# Patient Record
Sex: Male | Born: 1980 | Race: White | Hispanic: No | Marital: Single | Smoking: Current every day smoker
Health system: Southern US, Community
[De-identification: ages and names within clinical notes are randomized; demographics above are authoritative.]

## PROBLEM LIST (undated history)

## (undated) HISTORY — PX: APPENDECTOMY: SHX54

## (undated) HISTORY — PX: WISDOM TOOTH EXTRACTION: SHX21

## (undated) HISTORY — PX: TONSILLECTOMY: SUR1361

---

## 2014-06-10 ENCOUNTER — Emergency Department (HOSPITAL_BASED_OUTPATIENT_CLINIC_OR_DEPARTMENT_OTHER)
Admission: EM | Admit: 2014-06-10 | Discharge: 2014-06-10 | Disposition: A | Discharge status: Home / Self Care | Payer: Self-pay | Attending: Emergency Medicine | Admitting: Emergency Medicine

## 2014-06-10 ENCOUNTER — Encounter (HOSPITAL_BASED_OUTPATIENT_CLINIC_OR_DEPARTMENT_OTHER): Payer: Self-pay | Admitting: *Deleted

## 2014-06-10 ENCOUNTER — Emergency Department (HOSPITAL_BASED_OUTPATIENT_CLINIC_OR_DEPARTMENT_OTHER): Payer: Self-pay

## 2014-06-10 DIAGNOSIS — Z791 Long term (current) use of non-steroidal anti-inflammatories (NSAID): Secondary | ICD-10-CM | POA: Insufficient documentation

## 2014-06-10 DIAGNOSIS — S46911A Strain of unspecified muscle, fascia and tendon at shoulder and upper arm level, right arm, initial encounter: Secondary | ICD-10-CM | POA: Insufficient documentation

## 2014-06-10 DIAGNOSIS — Z880 Allergy status to penicillin: Secondary | ICD-10-CM | POA: Insufficient documentation

## 2014-06-10 DIAGNOSIS — W208XXA Other cause of strike by thrown, projected or falling object, initial encounter: Secondary | ICD-10-CM | POA: Insufficient documentation

## 2014-06-10 DIAGNOSIS — Y9389 Activity, other specified: Secondary | ICD-10-CM | POA: Insufficient documentation

## 2014-06-10 DIAGNOSIS — Y998 Other external cause status: Secondary | ICD-10-CM | POA: Insufficient documentation

## 2014-06-10 DIAGNOSIS — Z720 Tobacco use: Secondary | ICD-10-CM | POA: Insufficient documentation

## 2014-06-10 DIAGNOSIS — Y9289 Other specified places as the place of occurrence of the external cause: Secondary | ICD-10-CM | POA: Insufficient documentation

## 2014-06-10 MED ORDER — TRAMADOL HCL 50 MG PO TABS: 50.0000 mg | ORAL_TABLET | Freq: Four times a day (QID) | ORAL | Status: DC | PRN

## 2014-06-10 NOTE — ED Provider Notes (Signed)
CSN: [id number]     Arrival date & time 06/10/14  1436 History   First MD Initiated Contact with Patient 06/10/14 1451     Chief Complaint  Patient presents with  . Shoulder Injury     (Consider location/radiation/quality/duration/timing/severity/associated sxs/prior Treatment) HPI Comments: Pt states that he was moving a dresser and the other person dropped it and now he is having right shoulder pain that radiates to the right neck. No numbness or weakness. No previous injury  Patient is a 34 y.o. male presenting with shoulder injury. The history is provided by the patient. No language interpreter was used.  Shoulder Injury This is a new problem. The current episode started today. The problem occurs constantly. The problem has been unchanged. Pertinent negatives include no numbness or weakness. The symptoms are aggravated by bending. He has tried nothing for the symptoms.    History reviewed. No pertinent past medical history. Past Surgical History  Procedure Laterality Date  . Wisdom tooth extraction    . Appendectomy    . Tonsillectomy     No family history on file. History  Substance Use Topics  . Smoking status: Current Every Day Smoker -- 0.50 packs/day    Types: Cigarettes  . Smokeless tobacco: Never Used  . Alcohol Use: No    Review of Systems  Neurological: Negative for weakness and numbness.  All other systems reviewed and are negative.     Allergies  Penicillins  Home Medications   Prior to Admission medications   Medication Sig Start Date End Date Taking? Authorizing Provider  meloxicam (MOBIC) 7.5 MG tablet Take 7.5 mg by mouth daily.   Yes Historical Provider, MD   BP 136/84 mmHg  Pulse 101  Temp(Src) 97.8 F (36.6 C) (Oral)  Resp 16  Ht 6\' 1"  (1.854 m)  Wt 215 lb (97.523 kg)  BMI 28.37 kg/m2  SpO2 100% Physical Exam  Constitutional: He is oriented to person, place, and time. He appears well-developed and well-nourished.  Cardiovascular: Normal  rate and regular rhythm.   Pulmonary/Chest: Effort normal and breath sounds normal.  Musculoskeletal:  Tender to the anterior shoulder. Unable to posteriorly rotates. Grip strength equal  Neurological: He is alert and oriented to person, place, and time. He exhibits normal muscle tone. Coordination normal.  Skin: Skin is warm and dry.  Nursing note and vitals reviewed.   ED Course  Procedures (including critical care time) Labs Review Labs Reviewed - No data to display  Imaging Review Dg Shoulder Right  06/10/2014   CLINICAL DATA:  Lifting injury. Right shoulder pain. Initial encounter.  EXAM: RIGHT SHOULDER - 2+ VIEW  COMPARISON:  None.  FINDINGS: The mineralization and alignment are normal. There is no evidence of acute fracture or dislocation. The subacromial space is preserved.  IMPRESSION: No acute osseous findings.   Electronically Signed   By: Roxy HorsemanBill  Veazey M.D.   On: 06/10/2014 15:39     EKG Interpretation None      MDM   Final diagnoses:  Shoulder strain, right, initial encounter    Pt is neurologically intact. Will treat symptomatically with ultram. Pt given referral to Dr. Pearletha ForgeHudnall. No bony abnormality noted    Teressa LowerVrinda Patti Shorb, NP 06/10/14 1543  Elwin MochaBlair Walden, MD 06/10/14 [phone]

## 2014-06-10 NOTE — Discharge Instructions (Signed)

## 2014-06-10 NOTE — ED Notes (Signed)
Reports was carrying a dresser with assistance and other side was dropped and dresser pulled right down- c/o pain to right shoulder- able to move arm with difficulty- cms intact

## 2014-07-04 ENCOUNTER — Encounter (HOSPITAL_BASED_OUTPATIENT_CLINIC_OR_DEPARTMENT_OTHER): Payer: Self-pay

## 2014-07-04 ENCOUNTER — Emergency Department (HOSPITAL_BASED_OUTPATIENT_CLINIC_OR_DEPARTMENT_OTHER): Payer: BLUE CROSS/BLUE SHIELD

## 2014-07-04 ENCOUNTER — Emergency Department (HOSPITAL_BASED_OUTPATIENT_CLINIC_OR_DEPARTMENT_OTHER)
Admission: EM | Admit: 2014-07-04 | Discharge: 2014-07-04 | Disposition: A | Discharge status: Home / Self Care | Payer: BLUE CROSS/BLUE SHIELD | Attending: Emergency Medicine | Admitting: Emergency Medicine

## 2014-07-04 DIAGNOSIS — Z880 Allergy status to penicillin: Secondary | ICD-10-CM | POA: Diagnosis not present

## 2014-07-04 DIAGNOSIS — L02511 Cutaneous abscess of right hand: Secondary | ICD-10-CM | POA: Diagnosis not present

## 2014-07-04 DIAGNOSIS — Z791 Long term (current) use of non-steroidal anti-inflammatories (NSAID): Secondary | ICD-10-CM | POA: Diagnosis not present

## 2014-07-04 DIAGNOSIS — M79644 Pain in right finger(s): Secondary | ICD-10-CM | POA: Diagnosis present

## 2014-07-04 DIAGNOSIS — Z720 Tobacco use: Secondary | ICD-10-CM | POA: Insufficient documentation

## 2014-07-04 MED ORDER — CLINDAMYCIN HCL 300 MG PO CAPS: 300.0000 mg | ORAL_CAPSULE | Freq: Three times a day (TID) | ORAL | Status: DC

## 2014-07-04 MED ORDER — OXYCODONE-ACETAMINOPHEN 5-325 MG PO TABS
ORAL_TABLET | ORAL | Status: AC
  Administered 2014-07-04: 1 via ORAL
  Filled 2014-07-04: qty 1

## 2014-07-04 MED ORDER — LIDOCAINE HCL 2 % IJ SOLN
10.0000 mL | Freq: Once | INTRAMUSCULAR | Status: AC
  Administered 2014-07-04: 200 mg
  Filled 2014-07-04: qty 20

## 2014-07-04 MED ORDER — OXYCODONE-ACETAMINOPHEN 5-325 MG PO TABS: 2.0000 | ORAL_TABLET | Freq: Once | ORAL | Status: DC

## 2014-07-04 MED ORDER — OXYCODONE-ACETAMINOPHEN 5-325 MG PO TABS: 2.0000 | ORAL_TABLET | ORAL | Status: DC | PRN

## 2014-07-04 MED ORDER — OXYCODONE-ACETAMINOPHEN 5-325 MG PO TABS
1.0000 | ORAL_TABLET | Freq: Once | ORAL | Status: AC
  Administered 2014-07-04: 1 via ORAL

## 2014-07-04 MED ORDER — CLINDAMYCIN HCL 150 MG PO CAPS
300.0000 mg | ORAL_CAPSULE | Freq: Once | ORAL | Status: AC
  Administered 2014-07-04: 300 mg via ORAL
  Filled 2014-07-04: qty 2

## 2014-07-04 NOTE — ED Provider Notes (Signed)
CSN: [id number]     Arrival date & time 07/04/14  1445 History  This chart was scribed for Richardean Canal, MD by Modena Jansky, ED Scribe. This patient was seen in room [facility] and the patient's care was started at 5:25 PM.    Chief Complaint  Patient presents with  . Finger Injury   The history is provided by the patient. No language interpreter was used.  HPI Comments: Shaurya Rawdon is a 34 y.o. male who presents to the Emergency Department complaining of right finger injury that occurred 5 days ago. He states that he accidentally got a splinter stuck in his finger of his right hand. He reports that he waited to come the ED to see if the problems will resolve itself. He states that he noticed some swelling and redness on his right finger. He denies any hx of DM. He also denies any fever. He states that his tetanus status is UTD.    History reviewed. No pertinent past medical history. Past Surgical History  Procedure Laterality Date  . Wisdom tooth extraction    . Appendectomy    . Tonsillectomy     No family history on file. History  Substance Use Topics  . Smoking status: Current Every Day Smoker -- 0.50 packs/day    Types: Cigarettes  . Smokeless tobacco: Never Used  . Alcohol Use: No    Review of Systems  Constitutional: Negative for fever.  All other systems reviewed and are negative.  Allergies  Penicillins  Home Medications   Prior to Admission medications   Medication Sig Start Date End Date Taking? Authorizing Provider  meloxicam (MOBIC) 7.5 MG tablet Take 7.5 mg by mouth daily.    Historical Provider, MD   BP 132/90 mmHg  Pulse 98  Temp(Src) 98.2 F (36.8 C) (Oral)  Resp 20  Ht  (1.854 m)  Wt 215 lb (97.523 kg)  BMI 28.37 kg/m2  SpO2 98% Physical Exam  Constitutional: He is oriented to person, place, and time. He appears well-developed. No distress.  HENT:  Head: Normocephalic and atraumatic.  Neck: Neck supple. No tracheal deviation present.   Cardiovascular: Normal rate.   Pulmonary/Chest: Effort normal. No respiratory distress.  Musculoskeletal:  Decreased ROM due to swelling and pain.    Neurological: He is alert and oriented to person, place, and time.  Skin: Skin is warm and dry.  Splinter in the right 3rd digit, mid phalanx with no erythema, no bony TTP, no perionychia.  Cap reill is less than 2 seconds.   Psychiatric: He has a normal mood and affect. His behavior is normal.  Nursing note and vitals reviewed.   ED Course  FOREIGN BODY REMOVAL Date/Time: 07/04/2014 6:33 PM Performed by: Richardean Canal Authorized by: Richardean Canal Consent: Verbal consent obtained. Risks and benefits: risks, benefits and alternatives were discussed Consent given by: patient Patient understanding: patient states understanding of the procedure being performed Patient consent: the patient's understanding of the procedure matches consent given Procedure consent: procedure consent matches procedure scheduled Relevant documents: relevant documents present and verified Test results: test results available and properly labeled Required items: required blood products, implants, devices, and special equipment available Patient identity confirmed: verbally with patient Time out: Immediately prior to procedure a "time out" was called to verify the correct patient, procedure, equipment, support staff and site/side marked as required. Intake: R 3rd finger. Local anesthetic: lidocaine 2% without epinephrine Anesthetic total: 5 ml Patient sedated: no Patient restrained: yes Complexity:  simple 1 objects recovered. Post-procedure assessment: foreign body removed Patient tolerance: Patient tolerated the procedure well with no immediate complications Comments: Removed small splinter    (including critical care time) DIAGNOSTIC STUDIES: Oxygen Saturation is 98% on RA, normal by my interpretation.    COORDINATION OF CARE: 5:29 PM- Pt advised of plan  for treatment which includes medication and radiology and pt agrees.  Labs Review Labs Reviewed - No data to display  Imaging Review Dg Hand Complete Right  07/04/2014   CLINICAL DATA:  Splinter in right third finger 4 days ago. Swelling. Limited range of motion.  EXAM: RIGHT HAND - COMPLETE 3+ VIEW  COMPARISON:  None.  FINDINGS: Diffuse soft tissue swelling within the right middle finger. No visible radiopaque foreign body. There is a tiny bone fragment along the anterior base of the right third finger middle phalanx, most likely related to old injury. No acute bony abnormality.  IMPRESSION: Diffuse soft tissue swelling within the right middle finger. No radiopaque foreign body or acute bony abnormality.   Electronically Signed   By: Charlett NoseKevin  Dover M.D.   On: 07/04/2014 15:14     EKG Interpretation None      MDM   Final diagnoses:  None   Charlott RakesJoseph Reap is a 34 y.o. male here with splinter R 3rd finger with cellulitis. I was able to remove small splinter, there is some purulent discharge suggestive of small abscess as well. I was able to drain the abscess. Will dc home with clinda, percocet.   I personally performed the services described in this documentation, which was scribed in my presence. The recorded information has been reviewed and is accurate.    Richardean Canalavid H Yao, MD 07/04/14 [id number]

## 2014-07-04 NOTE — Discharge Instructions (Signed)
Take clindamycin 300 mg three times daily for a week.   Take motrin for pain.   Take percocet for severe pain.   Follow up with your doctor.   Return to ER if you have severe pain, fever, worse swelling.

## 2014-07-05 ENCOUNTER — Ambulatory Visit (HOSPITAL_BASED_OUTPATIENT_CLINIC_OR_DEPARTMENT_OTHER)
Admission: EM | Admit: 2014-07-05 | Discharge: 2014-07-06 | Disposition: A | Discharge status: Home / Self Care | Payer: BLUE CROSS/BLUE SHIELD | Attending: Orthopedic Surgery | Admitting: Orthopedic Surgery

## 2014-07-05 ENCOUNTER — Emergency Department (HOSPITAL_COMMUNITY): Payer: BLUE CROSS/BLUE SHIELD | Admitting: Certified Registered"

## 2014-07-05 ENCOUNTER — Encounter (HOSPITAL_BASED_OUTPATIENT_CLINIC_OR_DEPARTMENT_OTHER): Payer: Self-pay | Admitting: *Deleted

## 2014-07-05 ENCOUNTER — Encounter (HOSPITAL_COMMUNITY)
Admission: EM | Disposition: A | Discharge status: Home / Self Care | Payer: Self-pay | Source: Home / Self Care | Attending: Emergency Medicine

## 2014-07-05 DIAGNOSIS — M65141 Other infective (teno)synovitis, right hand: Secondary | ICD-10-CM | POA: Diagnosis not present

## 2014-07-05 DIAGNOSIS — M009 Pyogenic arthritis, unspecified: Secondary | ICD-10-CM | POA: Diagnosis not present

## 2014-07-05 DIAGNOSIS — F17210 Nicotine dependence, cigarettes, uncomplicated: Secondary | ICD-10-CM | POA: Diagnosis not present

## 2014-07-05 DIAGNOSIS — M79641 Pain in right hand: Secondary | ICD-10-CM | POA: Diagnosis present

## 2014-07-05 DIAGNOSIS — M659 Synovitis and tenosynovitis, unspecified: Secondary | ICD-10-CM | POA: Diagnosis present

## 2014-07-05 DIAGNOSIS — L02511 Cutaneous abscess of right hand: Secondary | ICD-10-CM

## 2014-07-05 HISTORY — PX: I & D EXTREMITY: SHX5045

## 2014-07-05 LAB — CBC WITH DIFFERENTIAL/PLATELET
Basophils Absolute: 0 10*3/uL (ref 0.0–0.1)
Basophils Relative: 0 % (ref 0–1)
Eosinophils Absolute: 0.2 10*3/uL (ref 0.0–0.7)
Eosinophils Relative: 1 % (ref 0–5)
HCT: 48.8 % (ref 39.0–52.0)
Hemoglobin: 16.2 g/dL (ref 13.0–17.0)
LYMPHS ABS: 2.6 10*3/uL (ref 0.7–4.0)
Lymphocytes Relative: 20 % (ref 12–46)
MCH: 30 pg (ref 26.0–34.0)
MCHC: 33.2 g/dL (ref 30.0–36.0)
MCV: 90.4 fL (ref 78.0–100.0)
MONO ABS: 0.9 10*3/uL (ref 0.1–1.0)
Monocytes Relative: 7 % (ref 3–12)
NEUTROS ABS: 9.5 10*3/uL — AB (ref 1.7–7.7)
NEUTROS PCT: 72 % (ref 43–77)
PLATELETS: 159 10*3/uL (ref 150–400)
RBC: 5.4 MIL/uL (ref 4.22–5.81)
RDW: 13.5 % (ref 11.5–15.5)
WBC: 13.1 10*3/uL — ABNORMAL HIGH (ref 4.0–10.5)

## 2014-07-05 LAB — BASIC METABOLIC PANEL
Anion gap: 5 (ref 5–15)
BUN: 10 mg/dL (ref 6–23)
CALCIUM: 8.8 mg/dL (ref 8.4–10.5)
CO2: 28 mmol/L (ref 19–32)
Chloride: 100 mmol/L (ref 96–112)
Creatinine, Ser: 1.14 mg/dL (ref 0.50–1.35)
GFR calc non Af Amer: 83 mL/min — ABNORMAL LOW (ref >90)
Glucose, Bld: 87 mg/dL (ref 70–99)
Potassium: 3.5 mmol/L (ref 3.5–5.1)
Sodium: 133 mmol/L — ABNORMAL LOW (ref 135–145)

## 2014-07-05 LAB — SEDIMENTATION RATE: Sed Rate: 12 mm/hr (ref 0–16)

## 2014-07-05 SURGERY — IRRIGATION AND DEBRIDEMENT EXTREMITY
Anesthesia: General | Site: Finger | Laterality: Right

## 2014-07-05 MED ORDER — MIDAZOLAM HCL 2 MG/2ML IJ SOLN
INTRAMUSCULAR | Status: AC
  Filled 2014-07-05: qty 2

## 2014-07-05 MED ORDER — SODIUM CHLORIDE 0.9 % IV BOLUS (SEPSIS)
500.0000 mL | Freq: Once | INTRAVENOUS | Status: AC
  Administered 2014-07-05: 500 mL via INTRAVENOUS

## 2014-07-05 MED ORDER — LACTATED RINGERS IV SOLN
INTRAVENOUS | Status: DC | PRN
  Administered 2014-07-05: via INTRAVENOUS

## 2014-07-05 MED ORDER — MORPHINE SULFATE 4 MG/ML IJ SOLN
4.0000 mg | Freq: Once | INTRAMUSCULAR | Status: AC
Start: 2014-07-05 — End: 2014-07-05
  Administered 2014-07-05: 4 mg via INTRAVENOUS
  Filled 2014-07-05: qty 1

## 2014-07-05 MED ORDER — PROPOFOL 10 MG/ML IV BOLUS
INTRAVENOUS | Status: AC
  Filled 2014-07-05: qty 20

## 2014-07-05 MED ORDER — VANCOMYCIN HCL IN DEXTROSE 1-5 GM/200ML-% IV SOLN
1000.0000 mg | Freq: Once | INTRAVENOUS | Status: AC
  Administered 2014-07-05: 1000 mg via INTRAVENOUS
  Filled 2014-07-05: qty 200

## 2014-07-05 MED ORDER — MORPHINE SULFATE 4 MG/ML IJ SOLN
4.0000 mg | Freq: Once | INTRAMUSCULAR | Status: AC
  Administered 2014-07-05: 4 mg via INTRAVENOUS
  Filled 2014-07-05: qty 1

## 2014-07-05 MED ORDER — SUFENTANIL CITRATE 50 MCG/ML IV SOLN
INTRAVENOUS | Status: AC
  Filled 2014-07-05: qty 1

## 2014-07-05 SURGICAL SUPPLY — 54 items
BANDAGE COBAN STERILE 2 (GAUZE/BANDAGES/DRESSINGS) IMPLANT
BANDAGE ELASTIC 3 VELCRO ST LF (GAUZE/BANDAGES/DRESSINGS) ×3 IMPLANT
BANDAGE ELASTIC 4 VELCRO ST LF (GAUZE/BANDAGES/DRESSINGS) IMPLANT
BNDG COHESIVE 1X5 TAN STRL LF (GAUZE/BANDAGES/DRESSINGS) IMPLANT
BNDG CONFORM 2 STRL LF (GAUZE/BANDAGES/DRESSINGS) IMPLANT
BNDG ESMARK 4X9 LF (GAUZE/BANDAGES/DRESSINGS) ×3 IMPLANT
BNDG GAUZE ELAST 4 BULKY (GAUZE/BANDAGES/DRESSINGS) ×3 IMPLANT
CORDS BIPOLAR (ELECTRODE) ×3 IMPLANT
COVER SURGICAL LIGHT HANDLE (MISCELLANEOUS) ×3 IMPLANT
DECANTER SPIKE VIAL GLASS SM (MISCELLANEOUS) ×3 IMPLANT
DRAIN PENROSE 1/4X12 LTX STRL (WOUND CARE) IMPLANT
DRSG ADAPTIC 3X8 NADH LF (GAUZE/BANDAGES/DRESSINGS) IMPLANT
DRSG EMULSION OIL 3X3 NADH (GAUZE/BANDAGES/DRESSINGS) IMPLANT
DRSG PAD ABDOMINAL 8X10 ST (GAUZE/BANDAGES/DRESSINGS) IMPLANT
GAUZE PACKING IODOFORM 1/4X15 (GAUZE/BANDAGES/DRESSINGS) ×3 IMPLANT
GAUZE SPONGE 4X4 12PLY STRL (GAUZE/BANDAGES/DRESSINGS) ×3 IMPLANT
GAUZE XEROFORM 1X8 LF (GAUZE/BANDAGES/DRESSINGS) IMPLANT
GLOVE BIO SURGEON STRL SZ7.5 (GLOVE) ×3 IMPLANT
GLOVE BIO SURGEON STRL SZ8 (GLOVE) ×3 IMPLANT
GLOVE BIOGEL PI IND STRL 8 (GLOVE) ×2 IMPLANT
GLOVE BIOGEL PI INDICATOR 8 (GLOVE) ×4
GOWN STRL REUS W/ TWL XL LVL3 (GOWN DISPOSABLE) ×1 IMPLANT
GOWN STRL REUS W/TWL XL LVL3 (GOWN DISPOSABLE) ×2
HANDPIECE INTERPULSE COAX TIP (DISPOSABLE)
KIT BASIN OR (CUSTOM PROCEDURE TRAY) ×3 IMPLANT
KIT ROOM TURNOVER OR (KITS) ×3 IMPLANT
LOOP VESSEL MAXI BLUE (MISCELLANEOUS) IMPLANT
LOOP VESSEL MINI RED (MISCELLANEOUS) IMPLANT
MANIFOLD NEPTUNE II (INSTRUMENTS) IMPLANT
NEEDLE 25GAX1.5 (MISCELLANEOUS) ×3 IMPLANT
NEEDLE HYPO 25X1 1.5 SAFETY (NEEDLE) IMPLANT
NS IRRIG 1000ML POUR BTL (IV SOLUTION) ×3 IMPLANT
PACK ORTHO EXTREMITY (CUSTOM PROCEDURE TRAY) ×3 IMPLANT
PAD ARMBOARD 7.5X6 YLW CONV (MISCELLANEOUS) ×3 IMPLANT
SCRUB BETADINE 4OZ XXX (MISCELLANEOUS) ×3 IMPLANT
SET HNDPC FAN SPRY TIP SCT (DISPOSABLE) IMPLANT
SOLUTION BETADINE 4OZ (MISCELLANEOUS) ×3 IMPLANT
SPONGE LAP 18X18 X RAY DECT (DISPOSABLE) IMPLANT
SPONGE LAP 4X18 X RAY DECT (DISPOSABLE) IMPLANT
SUCTION FRAZIER TIP 10 FR DISP (SUCTIONS) IMPLANT
SUT ETHILON 4 0 PS 2 18 (SUTURE) IMPLANT
SUT MON AB 5-0 P3 18 (SUTURE) IMPLANT
SWAB COLLECTION DEVICE MRSA (MISCELLANEOUS) ×3 IMPLANT
SYR 30ML LL (SYRINGE) ×3 IMPLANT
SYR CONTROL 10ML LL (SYRINGE) ×3 IMPLANT
TOWEL OR 17X24 6PK STRL BLUE (TOWEL DISPOSABLE) IMPLANT
TOWEL OR 17X26 10 PK STRL BLUE (TOWEL DISPOSABLE) ×3 IMPLANT
TUBE ANAEROBIC SPECIMEN COL (MISCELLANEOUS) ×3 IMPLANT
TUBE CONNECTING 12'X1/4 (SUCTIONS) ×1
TUBE CONNECTING 12X1/4 (SUCTIONS) ×2 IMPLANT
TUBE FEEDING 5FR 15 INCH (TUBING) ×3 IMPLANT
UNDERPAD 30X30 INCONTINENT (UNDERPADS AND DIAPERS) ×3 IMPLANT
WATER STERILE IRR 1000ML POUR (IV SOLUTION) IMPLANT
YANKAUER SUCT BULB TIP NO VENT (SUCTIONS) IMPLANT

## 2014-07-05 NOTE — ED Provider Notes (Signed)
Patient transferred to the [facility] for hand surgery follow-up. Patient reported a splinter in his right third finger that was removed yesterday as well as IND with purulent drainage. He was given clindamycin. He is presenting today with worsening relative redness and swelling has extended into his hand. Pt also with systemic symptoms. Patient given IV vancomycin as well as pain medicines with plan for hand surgery evaluation. Pt given additional pain medications here. Patient stable in no acute distress. Patient seen by Dr. Merlyn LotKuzma with plan for surgical IND tonight for Right long finger flexor sheath infection and possible dip joint infection.     Louann SjogrenVictoria L Marcella Charlson, PA-C 07/05/14 2333  Vida RollerBrian D Miller, MD 07/06/14 0000

## 2014-07-05 NOTE — ED Notes (Signed)
Patient was here yesterday for a foreign body in his right hand. Today worsened with increased swelling and pain as well as streaking up his fingers.

## 2014-07-05 NOTE — H&P (Signed)
Jesse Barnett is an 34 y.o. male.   Chief Complaint: right long finger infection HPI: 34 yo rhd male states he got wood splinter in right long finger 6 days ago.  4 days ago began having swelling and pain in finger.  Splinter removed a few days ago, but over last 24 hours has had increasing pain, swelling and fevers/chills/night sweats.  Reports previous injury to right hand with drill years ago and no other injury at this time.  History reviewed. No pertinent past medical history.  Past Surgical History  Procedure Laterality Date  . Wisdom tooth extraction    . Appendectomy    . Tonsillectomy      No family history on file. Social History:  reports that he has been smoking Cigarettes.  He has been smoking about 0.50 packs per day. He has never used smokeless tobacco. He reports that he does not drink alcohol or use illicit drugs.  Allergies:  Allergies  Allergen Reactions  . Penicillins Rash     (Not in a hospital admission)  Results for orders placed or performed during the hospital encounter of 07/05/14 (from the past 48 hour(s))  CBC with Differential     Status: Abnormal   Collection Time: 07/05/14  7:35 PM  Result Value Ref Range   WBC 13.1 (H) 4.0 - 10.5 K/uL   RBC 5.40 4.22 - 5.81 MIL/uL   Hemoglobin 16.2 13.0 - 17.0 g/dL   HCT 48.8 39.0 - 52.0 %   MCV 90.4 78.0 - 100.0 fL   MCH 30.0 26.0 - 34.0 pg   MCHC 33.2 30.0 - 36.0 g/dL   RDW 13.5 11.5 - 15.5 %   Platelets 159 150 - 400 K/uL   Neutrophils Relative % 72 43 - 77 %   Neutro Abs 9.5 (H) 1.7 - 7.7 K/uL   Lymphocytes Relative 20 12 - 46 %   Lymphs Abs 2.6 0.7 - 4.0 K/uL   Monocytes Relative 7 3 - 12 %   Monocytes Absolute 0.9 0.1 - 1.0 K/uL   Eosinophils Relative 1 0 - 5 %   Eosinophils Absolute 0.2 0.0 - 0.7 K/uL   Basophils Relative 0 0 - 1 %   Basophils Absolute 0.0 0.0 - 0.1 K/uL  Basic metabolic panel     Status: Abnormal   Collection Time: 07/05/14  7:35 PM  Result Value Ref Range   Sodium 133 (L)  135 - 145 mmol/L   Potassium 3.5 3.5 - 5.1 mmol/L   Chloride 100 96 - 112 mmol/L   CO2 28 19 - 32 mmol/L   Glucose, Bld 87 70 - 99 mg/dL   BUN 10 6 - 23 mg/dL   Creatinine, Ser 1.14 0.50 - 1.35 mg/dL   Calcium 8.8 8.4 - 10.5 mg/dL   GFR calc non Af Amer 83 (L) >90 mL/min   GFR calc Af Amer >90 >90 mL/min    Comment: (NOTE) The eGFR has been calculated using the CKD EPI equation. This calculation has not been validated in all clinical situations. eGFR's persistently <90 mL/min signify possible Chronic Kidney Disease.    Anion gap 5 5 - 15  Sedimentation rate     Status: None   Collection Time: 07/05/14  7:35 PM  Result Value Ref Range   Sed Rate 12 0 - 16 mm/hr    Dg Hand Complete Right  07/04/2014   CLINICAL DATA:  Splinter in right third finger 4 days ago. Swelling. Limited range of motion.  EXAM:  RIGHT HAND - COMPLETE 3+ VIEW  COMPARISON:  None.  FINDINGS: Diffuse soft tissue swelling within the right middle finger. No visible radiopaque foreign body. There is a tiny bone fragment along the anterior base of the right third finger middle phalanx, most likely related to old injury. No acute bony abnormality.  IMPRESSION: Diffuse soft tissue swelling within the right middle finger. No radiopaque foreign body or acute bony abnormality.   Electronically Signed   By: Jesse Barnett M.D.   On: 07/04/2014 15:14     A comprehensive review of systems was negative except for: Constitutional: positive for chills, fevers and night sweats  Blood pressure 133/90, pulse 108, temperature 99.8 F (37.7 C), temperature source Oral, resp. rate 20, height _0  (1.854 m), weight 97.523 kg (215 lb), SpO2 100 %.  General appearance: alert, cooperative and appears stated age Head: Normocephalic, without obvious abnormality, atraumatic Neck: supple, symmetrical, trachea midline Resp: clear to auscultation bilaterally Cardio: regular rate and rhythm GI: non tender Extremities: intact sensation and  capillary refill all digits.  +epl/fpl/io.  right long finger with puncture wound volar aspect middle phalanx.  finger swollen and erythematous.  pain with palpation volarly and with passive extension.  also ttp dip joint dorsally.   Pulses: 2+ and symmetric Skin: Skin color, texture, turgor normal. No rashes or lesions Neurologic: Grossly normal Incision/Wound: As above  Assessment/Plan Right long finger flexor sheath infection and possible dip joint infection.  Non operative and operative treatment options were discussed with the patient and patient wishes to proceed with operative treatment. Recommend OR for incision and drainage right long finger including flexor sheath and possibly dip joint.  Risks, benefits, and alternatives of surgery were discussed and the patient agrees with the plan of care.   [name] R 07/05/2014, 11:04 PM

## 2014-07-05 NOTE — ED Provider Notes (Addendum)
CSN: [id number]     Arrival date & time 07/05/14  1719 History  This chart was scribed for [employer]. Rubin Payor, MD by Tonye Royalty, ED Scribe. This patient was seen in room MH08/MH08 and the patient's care was started at 7:11 PM.    Chief Complaint  Patient presents with  . Hand Pain   The history is provided by the patient. No language interpreter was used.    HPI Comments: Jesse Barnett is a 34 y.o. male who presents to the Emergency Department complaining of right hand pain with onset 6 days ago. Symptoms began after he got a splinted stuck in 3rd finger on right hand. He was evaluated yesterday, foreign body was removed, abscess was drained with purulent drainage, and started on Clindamycin. He states swelling has increased significantly since yesterday.  History reviewed. No pertinent past medical history. Past Surgical History  Procedure Laterality Date  . Wisdom tooth extraction    . Appendectomy    . Tonsillectomy     No family history on file. History  Substance Use Topics  . Smoking status: Current Every Day Smoker -- 0.50 packs/day    Types: Cigarettes  . Smokeless tobacco: Never Used  . Alcohol Use: No    Review of Systems  Constitutional: Negative for fever.  Musculoskeletal:       Right hand swelling and pain  All other systems reviewed and are negative.    Allergies  Penicillins  Home Medications   Prior to Admission medications   Medication Sig Start Date End Date Taking? Authorizing Provider  clindamycin (CLEOCIN) 300 MG capsule Take 1 capsule (300 mg total) by mouth 3 (three) times daily. X 7 days 07/04/14   Richardean Canal, MD  meloxicam (MOBIC) 7.5 MG tablet Take 7.5 mg by mouth daily.    Historical Provider, MD  oxyCODONE-acetaminophen (PERCOCET) 5-325 MG per tablet Take 2 tablets by mouth every 4 (four) hours as needed. 07/04/14   Richardean Canal, MD   BP 130/94 mmHg  Pulse 107  Temp(Src) 98.3 F (36.8 C) (Oral)  Resp 18  Ht  (1.854 m)  Wt 215 lb  (97.523 kg)  BMI 28.37 kg/m2  SpO2 100% Physical Exam  Constitutional: He is oriented to person, place, and time. He appears well-developed and well-nourished.  HENT:  Head: Normocephalic and atraumatic.  Eyes: Conjunctivae are normal.  Neck: Normal range of motion. Neck supple.  Pulmonary/Chest: Effort normal.  Musculoskeletal: Normal range of motion.  Right middle finger swollen, tender, erythematous, and tracking up to palm of hand Very painful flexion and extension 0.5cm laceration/incision over palmar aspect of the middle phalanx  Neurological: He is alert and oriented to person, place, and time.  Skin: Skin is warm and dry.  Psychiatric: He has a normal mood and affect.  Nursing note and vitals reviewed.       ED Course  Procedures (including critical care time)  COORDINATION OF CARE: 7:15 PM Discussed treatment plan with patient at beside, the patient agrees with the plan and has no further questions at this time.  Labs Review Labs Reviewed - No data to display  Imaging Review Dg Hand Complete Right  07/04/2014   CLINICAL DATA:  Splinter in right third finger 4 days ago. Swelling. Limited range of motion.  EXAM: RIGHT HAND - COMPLETE 3+ VIEW  COMPARISON:  None.  FINDINGS: Diffuse soft tissue swelling within the right middle finger. No visible radiopaque foreign body. There is a tiny bone fragment along  the anterior base of the right third finger middle phalanx, most likely related to old injury. No acute bony abnormality.  IMPRESSION: Diffuse soft tissue swelling within the right middle finger. No radiopaque foreign body or acute bony abnormality.   Electronically Signed   By: Charlett NoseKevin  Dover M.D.   On: 07/04/2014 15:14     EKG Interpretation None      MDM   Final diagnoses:  None    Patient with apparent infection of finger. Seen yesterday and had drainage with possible abscess. Has been on clindamycin. Has gotten much worse and is now having chills. More  swelling and now involves the hand. Will discuss with hand surgery and will require transfer.  I personally performed the services described in this documentation, which was scribed in my presence. The recorded information has been reviewed and is accurate.    Juliet RudeNathan R. Rubin PayorPickering, MD 07/05/14 2012  Discussed with Dr. Merlyn LotKuzma. Will see the patient in the ER at [facility]. Patient will go by private vehicle. Discussed with Dr. Loretha StaplerWofford, the ER physician at [facility] who is aware the patient.  Juliet RudeNathan R. Rubin PayorPickering, MD 07/05/14 2100

## 2014-07-05 NOTE — Anesthesia Preprocedure Evaluation (Addendum)
Anesthesia Evaluation  Patient identified by MRN, date of birth, ID band Patient awake    Reviewed: Allergy & Precautions, NPO status , Patient's Chart, lab work & pertinent test results  Airway Mallampati: II  TM Distance: >3 FB Neck ROM: Full    Dental no notable dental hx.    Pulmonary Current Smoker,  breath sounds clear to auscultation  Pulmonary exam normal       Cardiovascular negative cardio ROS  Rhythm:Regular Rate:Normal     Neuro/Psych negative neurological ROS  negative psych ROS   GI/Hepatic negative GI ROS, Neg liver ROS,   Endo/Other  negative endocrine ROS  Renal/GU negative Renal ROS  negative genitourinary   Musculoskeletal negative musculoskeletal ROS (+)   Abdominal   Peds negative pediatric ROS (+)  Hematology negative hematology ROS (+)   Anesthesia Other Findings   Reproductive/Obstetrics negative OB ROS                            Anesthesia Physical Anesthesia Plan  ASA: II and emergent  Anesthesia Plan: General   Post-op Pain Management:    Induction: Intravenous  Airway Management Planned: LMA  Additional Equipment:   Intra-op Plan:   Post-operative Plan: Extubation in OR  Informed Consent: I have reviewed the patients History and Physical, chart, labs and discussed the procedure including the risks, benefits and alternatives for the proposed anesthesia with the patient or authorized representative who has indicated his/her understanding and acceptance.   Dental advisory given  Plan Discussed with: CRNA  Anesthesia Plan Comments: (NPO > 8 hours)       Anesthesia Quick Evaluation

## 2014-07-05 NOTE — ED Notes (Signed)
Pt stable and leaving with wife to go to [facility] (all paperwork printed and with pt). Report given to Clydie BraunKaren, Consulting civil engineercharge RN at [facility]. Danna HeftyGolden, Arath Kaigler Lee, RN

## 2014-07-06 ENCOUNTER — Encounter (HOSPITAL_COMMUNITY): Payer: Self-pay | Admitting: Orthopedic Surgery

## 2014-07-06 DIAGNOSIS — M659 Synovitis and tenosynovitis, unspecified: Secondary | ICD-10-CM | POA: Diagnosis present

## 2014-07-06 MED ORDER — ONDANSETRON HCL 4 MG PO TABS: 4.0000 mg | ORAL_TABLET | Freq: Four times a day (QID) | ORAL | Status: DC | PRN

## 2014-07-06 MED ORDER — METOCLOPRAMIDE HCL 10 MG PO TABS: 5.0000 mg | ORAL_TABLET | Freq: Three times a day (TID) | ORAL | Status: DC | PRN

## 2014-07-06 MED ORDER — LIDOCAINE HCL (CARDIAC) 20 MG/ML IV SOLN
INTRAVENOUS | Status: AC
  Filled 2014-07-06: qty 5

## 2014-07-06 MED ORDER — KETOROLAC TROMETHAMINE 30 MG/ML IJ SOLN
INTRAMUSCULAR | Status: AC
Start: 2014-07-06 — End: 2014-07-06
  Administered 2014-07-06: 30 mg
  Filled 2014-07-06: qty 1

## 2014-07-06 MED ORDER — LACTATED RINGERS IV SOLN: INTRAVENOUS | Status: DC

## 2014-07-06 MED ORDER — KETOROLAC TROMETHAMINE 30 MG/ML IJ SOLN: 30.0000 mg | Freq: Once | INTRAMUSCULAR | Status: DC | PRN

## 2014-07-06 MED ORDER — OXYCODONE-ACETAMINOPHEN 5-325 MG PO TABS: ORAL_TABLET | ORAL | Status: DC

## 2014-07-06 MED ORDER — METOCLOPRAMIDE HCL 5 MG/ML IJ SOLN: 5.0000 mg | Freq: Three times a day (TID) | INTRAMUSCULAR | Status: DC | PRN

## 2014-07-06 MED ORDER — ONDANSETRON HCL 4 MG/2ML IJ SOLN: 4.0000 mg | Freq: Four times a day (QID) | INTRAMUSCULAR | Status: DC | PRN

## 2014-07-06 MED ORDER — TEMAZEPAM 15 MG PO CAPS
15.0000 mg | ORAL_CAPSULE | Freq: Every evening | ORAL | Status: DC | PRN
  Administered 2014-07-06: 15 mg via ORAL
  Filled 2014-07-06: qty 1

## 2014-07-06 MED ORDER — OXYCODONE-ACETAMINOPHEN 5-325 MG PO TABS
1.0000 | ORAL_TABLET | ORAL | Status: DC | PRN
  Administered 2014-07-06 (×3): 2 via ORAL
  Filled 2014-07-06 (×3): qty 2

## 2014-07-06 MED ORDER — FENTANYL CITRATE 0.05 MG/ML IJ SOLN
INTRAMUSCULAR | Status: AC
  Filled 2014-07-06: qty 2

## 2014-07-06 MED ORDER — SODIUM CHLORIDE 0.9 % IR SOLN
Status: DC | PRN
  Administered 2014-07-06: 1000 mL

## 2014-07-06 MED ORDER — METHOCARBAMOL 500 MG PO TABS
ORAL_TABLET | ORAL | Status: AC
  Filled 2014-07-06: qty 1

## 2014-07-06 MED ORDER — PROMETHAZINE HCL 25 MG/ML IJ SOLN: 6.2500 mg | INTRAMUSCULAR | Status: DC | PRN

## 2014-07-06 MED ORDER — SODIUM CHLORIDE 0.9 % IV SOLN
1500.0000 mg | Freq: Once | INTRAVENOUS | Status: AC
  Administered 2014-07-06: 1500 mg via INTRAVENOUS
  Filled 2014-07-06: qty 1500

## 2014-07-06 MED ORDER — SULFAMETHOXAZOLE-TRIMETHOPRIM 800-160 MG PO TABS: 1.0000 | ORAL_TABLET | Freq: Two times a day (BID) | ORAL | Status: DC

## 2014-07-06 MED ORDER — PROPOFOL 10 MG/ML IV BOLUS
INTRAVENOUS | Status: DC | PRN
  Administered 2014-07-06: 300 mg via INTRAVENOUS

## 2014-07-06 MED ORDER — DIPHENHYDRAMINE HCL 12.5 MG/5ML PO ELIX: 12.5000 mg | ORAL_SOLUTION | ORAL | Status: DC | PRN

## 2014-07-06 MED ORDER — ONDANSETRON HCL 4 MG/2ML IJ SOLN
INTRAMUSCULAR | Status: DC | PRN
  Administered 2014-07-06: 4 mg via INTRAVENOUS

## 2014-07-06 MED ORDER — MIDAZOLAM HCL 5 MG/5ML IJ SOLN
INTRAMUSCULAR | Status: DC | PRN
  Administered 2014-07-05: 2 mg via INTRAVENOUS

## 2014-07-06 MED ORDER — DEXTROSE 5 % IV SOLN: 500.0000 mg | Freq: Four times a day (QID) | INTRAVENOUS | Status: DC | PRN

## 2014-07-06 MED ORDER — BUPIVACAINE HCL (PF) 0.25 % IJ SOLN
INTRAMUSCULAR | Status: AC
  Filled 2014-07-06: qty 30

## 2014-07-06 MED ORDER — ONDANSETRON HCL 4 MG/2ML IJ SOLN
INTRAMUSCULAR | Status: AC
  Filled 2014-07-06: qty 2

## 2014-07-06 MED ORDER — FENTANYL CITRATE 0.05 MG/ML IJ SOLN
INTRAMUSCULAR | Status: AC
Start: 2014-07-06 — End: 2014-07-06
  Filled 2014-07-06: qty 2

## 2014-07-06 MED ORDER — MORPHINE SULFATE 2 MG/ML IJ SOLN
1.0000 mg | INTRAMUSCULAR | Status: DC | PRN
  Administered 2014-07-06: 1 mg via INTRAVENOUS
  Filled 2014-07-06: qty 1

## 2014-07-06 MED ORDER — SUFENTANIL CITRATE 50 MCG/ML IV SOLN
INTRAVENOUS | Status: DC | PRN
  Administered 2014-07-06 (×2): 10 ug via INTRAVENOUS

## 2014-07-06 MED ORDER — FENTANYL CITRATE 0.05 MG/ML IJ SOLN
25.0000 ug | INTRAMUSCULAR | Status: DC | PRN
  Administered 2014-07-06 (×3): 50 ug via INTRAVENOUS

## 2014-07-06 MED ORDER — BUPIVACAINE HCL 0.25 % IJ SOLN
INTRAMUSCULAR | Status: DC | PRN
  Administered 2014-07-06: 10 mL

## 2014-07-06 MED ORDER — PROPOFOL 10 MG/ML IV BOLUS
INTRAVENOUS | Status: AC
  Filled 2014-07-06: qty 20

## 2014-07-06 MED ORDER — SUCCINYLCHOLINE CHLORIDE 20 MG/ML IJ SOLN
INTRAMUSCULAR | Status: AC
  Filled 2014-07-06: qty 1

## 2014-07-06 MED ORDER — METHOCARBAMOL 500 MG PO TABS
500.0000 mg | ORAL_TABLET | Freq: Four times a day (QID) | ORAL | Status: DC | PRN
  Administered 2014-07-06: 500 mg via ORAL
  Filled 2014-07-06 (×4): qty 1

## 2014-07-06 MED ORDER — SODIUM CHLORIDE 0.9 % IJ SOLN
INTRAMUSCULAR | Status: AC
  Filled 2014-07-06: qty 10

## 2014-07-06 MED ORDER — DEXAMETHASONE SODIUM PHOSPHATE 4 MG/ML IJ SOLN
INTRAMUSCULAR | Status: AC
  Filled 2014-07-06: qty 1

## 2014-07-06 MED ORDER — MELOXICAM 7.5 MG PO TABS
7.5000 mg | ORAL_TABLET | Freq: Every day | ORAL | Status: DC
  Administered 2014-07-06: 7.5 mg via ORAL
  Filled 2014-07-06: qty 1

## 2014-07-06 MED ORDER — DEXAMETHASONE SODIUM PHOSPHATE 4 MG/ML IJ SOLN
INTRAMUSCULAR | Status: DC | PRN
  Administered 2014-07-06: 4 mg via INTRAVENOUS

## 2014-07-06 MED ORDER — LIDOCAINE HCL (CARDIAC) 20 MG/ML IV SOLN
INTRAVENOUS | Status: DC | PRN
  Administered 2014-07-06: 100 mg via INTRAVENOUS

## 2014-07-06 NOTE — Discharge Instructions (Signed)

## 2014-07-06 NOTE — Anesthesia Procedure Notes (Signed)
Procedure Name: LMA Insertion Date/Time: 07/06/2014 12:03 AM Performed by: Melina SchoolsBANKS, Tehilla Coffel J Pre-anesthesia Checklist: Patient identified, Emergency Drugs available, Suction available, Patient being monitored and Timeout performed Patient Re-evaluated:Patient Re-evaluated prior to inductionOxygen Delivery Method: Circle system utilized Preoxygenation: Pre-oxygenation with 100% oxygen Intubation Type: IV induction Ventilation: Mask ventilation without difficulty LMA: LMA with gastric port inserted LMA Size: 4.0 Number of attempts: 1 Placement Confirmation: positive ETCO2 and breath sounds checked- equal and bilateral Tube secured with: Tape Dental Injury: Teeth and Oropharynx as per pre-operative assessment

## 2014-07-06 NOTE — Brief Op Note (Signed)
07/05/2014 - 07/06/2014  1:11 AM  PATIENT:  Jesse Barnett  34 y.o. male  PRE-OPERATIVE DIAGNOSIS:  INFECTED RIGHT LONG FINGER  POST-OPERATIVE DIAGNOSIS:  INFECTED RIGHT LONG FINGER  PROCEDURE:  Procedure(s): IRRIGATION AND DEBRIDEMENT RIGHT LONG FINGER FLEXOR SHEATH (Right)  And dip joint  SURGEON:  Surgeon(s) and Role:    * Betha LoaKevin Quaron Delacruz, MD - Primary  PHYSICIAN ASSISTANT:   ASSISTANTS: none   ANESTHESIA:   general  EBL:     BLOOD ADMINISTERED:none  DRAINS: iodoform packing, pediatric feeding tube drain  LOCAL MEDICATIONS USED:  MARCAINE     SPECIMEN:  Source of Specimen:  right long finger  DISPOSITION OF SPECIMEN:  micro  COUNTS:  YES  TOURNIQUET:   Total Tourniquet Time Documented: Upper Arm (Right) - 48 minutes Total: Upper Arm (Right) - 48 minutes   DICTATION: .Other Dictation: Dictation Number ?  PLAN OF CARE: Admit for overnight observation  PATIENT DISPOSITION:  PACU - hemodynamically stable.

## 2014-07-06 NOTE — Op Note (Signed)
NAMELYNCOLN, LEDGERWOOD NO.:  [id number]  MEDICAL RECORD NO.:  [id number]  LOCATION:  5N19C                        FACILITY:  [facility]  PHYSICIAN:  Betha Loa, MD        DATE OF BIRTH:  June 11, 1980  DATE OF PROCEDURE:  07/06/2014 DATE OF DISCHARGE:                              OPERATIVE REPORT   PREOPERATIVE DIAGNOSIS:  Right long finger flexor sheath infection and distal interphalangeal joint infection.  POSTOPERATIVE DIAGNOSIS:  Right long finger flexor sheath infection and distal interphalangeal joint infection.  PROCEDURE:   1. Incision and drainage right long finger flexor sheath 2. Incision and drainage right long finger distal interphalangeal joint  through separate incision.  SURGEON:  Betha Loa, MD  ASSISTANTS:  None.  ANESTHESIA:  General.  IV FLUIDS:  Per anesthesia flow sheet.  ESTIMATED BLOOD LOSS:  Minimal.  COMPLICATIONS:  None.  SPECIMENS:  Cultures to micro.  TOURNIQUET TIME:  48 minutes.  DISPOSITION:  Stable to PACU.  INDICATIONS:  Mr. Jesse Barnett is a 34 year old right-hand-dominant male who states approximately a week ago he got a wood splinter into his right long finger.  He has had progressive pain, swelling, and redness of the right long finger.  He was seen a few days ago where the foreign body was removed.  He had continued pain and swelling.  He returned to the [facility]  today where he was evaluated, and transferred for further care.  On examination, he had a tender and swollen right long finger.  I recommended going to the operating room for incision and drainage of the right long finger.  Risks, benefits, and alternatives of surgery were discussed including risk of blood loss, infection, damage to nerves, vessels, tendons, ligaments, bone, failure of surgery, need for additional surgery, complications with wound healing, continued pain, continued infection, need for repeat irrigation and debridement. He voiced  understanding of these risks and elected to proceed.  OPERATIVE COURSE:  After being identified preoperatively by myself, the patient and I agreed upon procedure and site of procedure.  Surgical site was marked.  The risks, benefits, and alternatives of surgery were reviewed and he wished to proceed.  Surgical consent had been signed. He had been given IV vancomycin at [facility]  He was transported to the operating room and placed on the operating room table in supine position with the right upper extremity on arm board.  General anesthesia was induced by Anesthesiology.  Right upper extremity was prepped and draped in normal sterile orthopedic fashion.  Surgical pause was performed between surgeons, anesthesia, and operating room staff, and all were in agreement as to the patient, procedure, and site of procedure.  Tourniquet at the proximal aspect of the extremity was inflated to 250 mmHg after exsanguination with an Esmarch bandage. Incision was made in the palm over the MP joint of the long finger and additionally at the distal phalanx volarly.  These were carried into subcutaneous tissues by spreading technique.  The flexor sheath was entered at the proximal wound.  There was gross purulence.  Cultures were taken for aerobes, anaerobes, and Gram stain.  The sheath was entered distally.  Again, there was gross purulence.  The wound in the middle phalanx was extended both proximally and distally and opened. There was no gross purulence in this area.  A #5 pediatric feeding tube was advanced from both proximal and distal into the tendon sheath and the tendon sheath irrigated with sterile saline.  It was then returned to the proximal wound and advanced into the tendon sheath.  Irrigation from this site provided good effluent from both the proximal and distal wounds.  The tendon sheath was copiously irrigated with sterile saline. Incision was made at the dorsal radial side of  the long finger at the DIP joint.  This was carried into subcutaneous tissues by spreading technique.  The DIP joint was entered underneath the extensor tendon. There was gross purulence.  The DIP joint was copiously irrigated with sterile saline by Angiocath sheath.  The wounds were all packed with quarter-inch iodoform gauze.  The feeding tube was left in as a drain. A digital block was performed with 10 mL of 0.25% plain Marcaine to aid in postoperative analgesia.  The wounds were dressed with sterile 4x4s and wrapped with a Kerlix bandage.  A volar splint was placed and wrapped with Kerlix and Ace bandage.  Tourniquet was deflated at 48 minutes.  Fingertips were pink with brisk capillary refill after deflation of the tourniquet.  Operative drapes were broken down, and the patient was awoken from anesthesia safely.  He was transferred back to stretcher and taken to PACU in stable condition.  I will see him back in the office in approximately 2 days for postoperative followup.  He will be kept overnight for IV antibiotics again in the morning.     Betha LoaKevin Seline Enzor, MD     KK/MEDQ  D:  07/06/2014  T:  07/06/2014  Job:  [id number]

## 2014-07-06 NOTE — Transfer of Care (Signed)
Immediate Anesthesia Transfer of Care Note  Patient: Jesse RakesJoseph Barnett  Procedure(s) Performed: Procedure(s): IRRIGATION AND DEBRIDEMENT RIGHT LONG FINGER FLEXOR SHEATH (Right)  Patient Location: PACU  Anesthesia Type:General  Level of Consciousness: sedated, patient cooperative and responds to stimulation  Airway & Oxygen Therapy: Patient Spontanous Breathing and Patient connected to nasal cannula oxygen  Post-op Assessment: Report given to RN, Post -op Vital signs reviewed and stable and Patient moving all extremities X 4  Post vital signs: Reviewed and stable  Last Vitals:  Filed Vitals:   07/06/14 0117  BP:   Pulse:   Temp: 37.3 C  Resp: 16    Complications: No apparent anesthesia complications

## 2014-07-06 NOTE — Anesthesia Postprocedure Evaluation (Signed)
  Anesthesia Post-op Note  Patient: Jesse RakesJoseph Barnett  Procedure(s) Performed: Procedure(s): IRRIGATION AND DEBRIDEMENT RIGHT LONG FINGER FLEXOR SHEATH (Right)  Patient Location: PACU  Anesthesia Type: General   Level of Consciousness: awake, alert  and oriented  Airway and Oxygen Therapy: Patient Spontanous Breathing  Post-op Pain: mild  Post-op Assessment: Post-op Vital signs reviewed  Post-op Vital Signs: Reviewed  Last Vitals:  Filed Vitals:   07/06/14 0600  BP: 86/46  Pulse: 77  Temp: 36.6 C  Resp: 16    Complications: No apparent anesthesia complications

## 2014-07-08 NOTE — Discharge Summary (Signed)
NAMCharlott Rakes:  Chronister, Jakai               ACCOUNT NO.:  [id number]  MEDICAL RECORD NO.:  [id number]  LOCATION:  5N19C                        FACILITY:  [facility]  PHYSICIAN:  Betha LoaKevin Kameela Leipold, MD        DATE OF BIRTH:  1981/02/20  DATE OF ADMISSION:  07/05/2014 DATE OF DISCHARGE:  07/06/2014                              DISCHARGE SUMMARY   FINAL DIAGNOSIS:  Right long finger flexor sheath infection.  PROCEDURES:  Right long finger incision and drainage of the flexor sheath and DIP joint.  HISTORY:  Mr. Vennie Homanserras is a 34 year old male who, for approximately 1 week, had increasing pain, swelling, and erythema of the right long finger after getting a piece of wood in his finger.  He presented to the emergency department where he was evaluated and felt to have an infection.  I was consulted for management of injury.  It was recommended to Mr. Kuhnle incision and drainage of the finger.  Risks, benefits, alternatives of the surgery were discussed including risk of blood loss, infection, damage to nerves, vessels, tendons, ligaments, bone; failure of surgery; need for additional surgery, complications with wound healing, continued pain, continued infection, need for repeat irrigation and debridement.  He voiced understanding of these risks and elected to proceed.  HOSPITAL COURSE:  Mr. Vennie Homanserras was taken to the operating room on July 05, 2014, for incision and drainage of the right long finger including the flexor sheath and DIP joint.  This was performed without complication.  He was kept overnight for observation and IV antibiotics. He was discharged home on July 06, 2014.  He followed in the office later that day.  He was given a prescription for Percocet 5/325, 1 to 2 p.o. q.6 hours p.r.n. pain, dispensed #40, and Bactrim DS 1 p.o. b.i.d. x7 days.     Betha LoaKevin Silvano Garofano, MD     KK/MEDQ  D:  07/07/2014  T:  07/08/2014  Job:  [id number]

## 2014-07-09 LAB — CULTURE, ROUTINE-ABSCESS: Culture: NO GROWTH

## 2014-07-10 LAB — ANAEROBIC CULTURE

## 2015-05-27 ENCOUNTER — Emergency Department (HOSPITAL_BASED_OUTPATIENT_CLINIC_OR_DEPARTMENT_OTHER): Payer: Self-pay

## 2015-05-27 ENCOUNTER — Encounter (HOSPITAL_BASED_OUTPATIENT_CLINIC_OR_DEPARTMENT_OTHER): Payer: Self-pay

## 2015-05-27 ENCOUNTER — Emergency Department (HOSPITAL_BASED_OUTPATIENT_CLINIC_OR_DEPARTMENT_OTHER)
Admission: EM | Admit: 2015-05-27 | Discharge: 2015-05-27 | Discharge status: Against Medical Advice | Payer: Self-pay | Attending: Emergency Medicine | Admitting: Emergency Medicine

## 2015-05-27 DIAGNOSIS — R05 Cough: Secondary | ICD-10-CM | POA: Insufficient documentation

## 2015-05-27 DIAGNOSIS — I209 Angina pectoris, unspecified: Secondary | ICD-10-CM | POA: Insufficient documentation

## 2015-05-27 DIAGNOSIS — Z880 Allergy status to penicillin: Secondary | ICD-10-CM | POA: Insufficient documentation

## 2015-05-27 DIAGNOSIS — R000 Tachycardia, unspecified: Secondary | ICD-10-CM | POA: Insufficient documentation

## 2015-05-27 DIAGNOSIS — I208 Other forms of angina pectoris: Secondary | ICD-10-CM

## 2015-05-27 DIAGNOSIS — F17210 Nicotine dependence, cigarettes, uncomplicated: Secondary | ICD-10-CM | POA: Insufficient documentation

## 2015-05-27 LAB — CBC WITH DIFFERENTIAL/PLATELET
BASOS ABS: 0 10*3/uL (ref 0.0–0.1)
Basophils Relative: 0 %
EOS ABS: 0.1 10*3/uL (ref 0.0–0.7)
EOS PCT: 1 %
HCT: 52.1 % — ABNORMAL HIGH (ref 39.0–52.0)
Hemoglobin: 17.4 g/dL — ABNORMAL HIGH (ref 13.0–17.0)
Lymphocytes Relative: 14 %
Lymphs Abs: 1.6 10*3/uL (ref 0.7–4.0)
MCH: 30.2 pg (ref 26.0–34.0)
MCHC: 33.4 g/dL (ref 30.0–36.0)
MCV: 90.3 fL (ref 78.0–100.0)
Monocytes Absolute: 0.6 10*3/uL (ref 0.1–1.0)
Monocytes Relative: 6 %
Neutro Abs: 8.5 10*3/uL — ABNORMAL HIGH (ref 1.7–7.7)
Neutrophils Relative %: 79 %
PLATELETS: 170 10*3/uL (ref 150–400)
RBC: 5.77 MIL/uL (ref 4.22–5.81)
RDW: 13 % (ref 11.5–15.5)
WBC: 10.8 10*3/uL — AB (ref 4.0–10.5)

## 2015-05-27 LAB — BASIC METABOLIC PANEL
Anion gap: 10 (ref 5–15)
BUN: 7 mg/dL (ref 6–20)
CALCIUM: 9 mg/dL (ref 8.9–10.3)
CHLORIDE: 102 mmol/L (ref 101–111)
CO2: 27 mmol/L (ref 22–32)
Creatinine, Ser: 1.09 mg/dL (ref 0.61–1.24)
GFR calc non Af Amer: >60 mL/min (ref >60)
GLUCOSE: 81 mg/dL (ref 65–99)
POTASSIUM: 3.6 mmol/L (ref 3.5–5.1)
Sodium: 139 mmol/L (ref 135–145)

## 2015-05-27 LAB — TROPONIN I: Troponin I: <0.03 ng/mL (ref <0.031)

## 2015-05-27 LAB — D-DIMER, QUANTITATIVE: D-Dimer, Quant: <0.27 ug/mL-FEU (ref 0.00–0.50)

## 2015-05-27 MED ORDER — NITROGLYCERIN 0.4 MG SL SUBL
0.4000 mg | SUBLINGUAL_TABLET | SUBLINGUAL | Status: DC | PRN
  Administered 2015-05-27 (×2): 0.4 mg via SUBLINGUAL
  Filled 2015-05-27: qty 1

## 2015-05-27 MED ORDER — ASPIRIN 81 MG PO CHEW
324.0000 mg | CHEWABLE_TABLET | Freq: Once | ORAL | Status: AC
  Administered 2015-05-27: 324 mg via ORAL
  Filled 2015-05-27: qty 4

## 2015-05-27 MED ORDER — SODIUM CHLORIDE 0.9 % IV BOLUS (SEPSIS)
1000.0000 mL | Freq: Once | INTRAVENOUS | Status: AC
  Administered 2015-05-27: 1000 mL via INTRAVENOUS

## 2015-05-27 MED ORDER — ONDANSETRON HCL 4 MG/2ML IJ SOLN
4.0000 mg | Freq: Once | INTRAMUSCULAR | Status: AC
  Administered 2015-05-27: 4 mg via INTRAVENOUS
  Filled 2015-05-27: qty 2

## 2015-05-27 MED ORDER — MORPHINE SULFATE (PF) 4 MG/ML IV SOLN
4.0000 mg | Freq: Once | INTRAVENOUS | Status: AC
  Administered 2015-05-27: 4 mg via INTRAVENOUS
  Filled 2015-05-27: qty 1

## 2015-05-27 NOTE — ED Notes (Signed)
Pt chest pain non relieved after second nitro, pt developed worsening pain with increased heart rate to 151, regular, pt diaphoretic, pain 10/10

## 2015-05-27 NOTE — ED Provider Notes (Signed)
CSN: [id number]     Arrival date & time 05/27/15  1618 History   First MD Initiated Contact with Patient 05/27/15 1652     Chief Complaint  Patient presents with  . Chest Pain     (Consider location/radiation/quality/duration/timing/severity/associated sxs/prior Treatment) HPI   35 year old male with a significant family history of premature cardiac disease who presents for evaluation of chest pain. Patient reports gradual onset of persistent midsternal chest pressure for the past 2 days. Pain is waxing and waning but became much more intensified approximate an hour ago. Pain is now sharp, radiates down to his left arm with associate nausea, diaphoresis, lightheadedness, shortness of breath. Endorse pleuritic pain when he take a deep breath. Denies any exertional chest pain. Endorse occasional nonproductive cough today. Pain felt different from prior heartburn and he did not eat anything today. Patient is currently a smoker but denies any alcohol or illicit drug use especially no cocaine use. Pain has since improved from a 9 out of 10 to a 6 out of 10 without any specific treatment. No prior history of PE or DVT, no recent surgery, prolonged bed rest, unilateral leg swelling or calf pain, or active cancer or hemoptysis. Patient recently is trying to improve on his health and has moderate weight loss without using any diet pills. Patient is currently concerning for possible heart attack.    History reviewed. No pertinent past medical history. Past Surgical History  Procedure Laterality Date  . Wisdom tooth extraction    . Appendectomy    . Tonsillectomy    . I&d extremity Right 07/05/2014    Procedure: IRRIGATION AND DEBRIDEMENT RIGHT LONG FINGER FLEXOR SHEATH;  Surgeon: Betha Loa, MD;  Location: [facility] OR;  Service: Orthopedics;  Laterality: Right;   No family history on file. Social History  Substance Use Topics  . Smoking status: Current Every Day Smoker -- 0.50 packs/day    Types:  Cigarettes  . Smokeless tobacco: Never Used  . Alcohol Use: No    Review of Systems  All other systems reviewed and are negative.     Allergies  Penicillins  Home Medications   Prior to Admission medications   Medication Sig Start Date End Date Taking? Authorizing Provider  testosterone cypionate (DEPOTESTOTERONE CYPIONATE) 200 MG/ML injection  05/04/14   Historical Provider, MD   BP 148/104 mmHg  Pulse 110  Temp(Src) 98.7 F (37.1 C) (Oral)  Resp 20  Ht  (1.854 m)  Wt 87.091 kg  BMI 25.34 kg/m2  SpO2 100% Physical Exam  Constitutional: He appears well-developed and well-nourished. No distress.  Awake, alert, nontoxic appearance  HENT:  Head: Atraumatic.  Eyes: Conjunctivae are normal. Right eye exhibits no discharge. Left eye exhibits no discharge.  Neck: Normal range of motion. Neck supple.  Cardiovascular: Normal rate, regular rhythm and intact distal pulses.   Pulmonary/Chest: Effort normal. No respiratory distress. He exhibits no tenderness.  Abdominal: Soft. There is no tenderness. There is no rebound.  Musculoskeletal: He exhibits no edema or tenderness.  ROM appears intact, no obvious focal weakness  Neurological: He is alert.  Skin: Skin is warm and dry. No rash noted.  Psychiatric: He has a normal mood and affect.  Nursing note and vitals reviewed.   ED Course  Procedures (including critical care time) Labs Review Labs Reviewed  CBC WITH DIFFERENTIAL/PLATELET - Abnormal; Notable for the following:    WBC 10.8 (*)    Hemoglobin 17.4 (*)    HCT 52.1 (*)  Neutro Abs 8.5 (*)    All other components within normal limits  TROPONIN I  BASIC METABOLIC PANEL  D-DIMER, QUANTITATIVE (NOT AT [facility])    Imaging Review Dg Chest 2 View  05/27/2015  CLINICAL DATA:  Left chest pain. Numbness in the left upper extremity today. It down report from 04/13/1997 EXAM: CHEST  2 VIEW COMPARISON:  None. FINDINGS: The heart size and mediastinal contours are within  normal limits. Both lungs are clear. The visualized skeletal structures are unremarkable aside from a congenitally bifid right fourth rib anteriorly. IMPRESSION: No active cardiopulmonary disease. Electronically Signed   By: Gaylyn Rong M.D.   On: 05/27/2015 19:02   I have personally reviewed and evaluated these images and lab results as part of my medical decision-making.   EKG Interpretation   Date/Time:  Thursday May 27 2015 16:32:10 EST Ventricular Rate:  113 PR Interval:  136 QRS Duration: 90 QT Interval:  314 QTC Calculation: 430 R Axis:   86 Text Interpretation:  Sinus tachycardia Otherwise normal ECG No previous  ECGs available Confirmed by NGUYEN, EMILY ([id number]) on 05/27/2015 5:07:53 PM      MDM   Final diagnoses:  Anginal chest pain at rest (HCC)    BP 123/91 mmHg  Pulse 83  Temp(Src) 98.7 F (37.1 C) (Oral)  Resp 10  Ht  (1.854 m)  Wt 87.091 kg  BMI 25.34 kg/m2  SpO2 100%   5:23 PM Patient presents with angina equivalent chest pain with a strong family history of premature cardiac disease. Workup initiated.  6:01 PM After receiving ASA and SL nitro, pt report worsening pain, and became increasingly tachycardic.  EKG showing sinus tachycardia without acute ischemic changes.  His sxs is concerning for possible right heart strain with decrease preload.  IVF given, morphine given and pt will be monitored closely.  Care discussed with Dr. Cyndie Chime.    7:15 PM After receiving morphine, patient states that he felt much better. He is currently resting comfortably. His initial troponin is unremarkable, negative d-dimer, labs are reassuring chest x-ray without any acute pathology. Plan to obtain delta troponin, and obtain chest CT angio to r/o dissection or PE.  With a significant family hx of cardiac disease (dad having cardiac disease at age 37, and having bypass age 34), will consult medicine for admission for cardiac rule out.    9:34 PM I'll nurse  notified that patient requested to go out to his truck to "get something" the nurses recommend patient to stay and security can get it however patient states he does not want to stay any longer. When I went to the room, patient has left. Security did track bound patient and remove IV and patient subsequently signed AMA. Patient was encouraged to return if symptoms worsen. Patient left without having a full workup.  Fayrene Helper, PA-C 05/27/15 2135  Leta Baptist, MD 05/28/15 [phone]

## 2015-05-27 NOTE — ED Notes (Signed)
Pain decreased, 2l o2 applied for comfort, pt states pain has improved, second ekg obtained reviewed with md, pt comfortable at this time,  Cardiac monitor in place,

## 2015-05-27 NOTE — ED Notes (Signed)
Pt wanted to go to truck to get something,  Was told security would go get it ,  Told rn that he would just leave, pt was told to please wait till pa could come talk to him,  Micah Flesher to get pa when returned to room pt had left,  rn went to parking lot, found pt,  pt returned to room 3 let rn remove iv, signed ama,  Pt was told if he had any problems to get to a hospital,  Pt states he would, he then apologized to rn for being disrespectfully and stated he had work to do

## 2015-05-27 NOTE — ED Notes (Signed)
CP since Tuesday

## 2015-09-16 DIAGNOSIS — E291 Testicular hypofunction: Secondary | ICD-10-CM | POA: Insufficient documentation

## 2016-06-28 ENCOUNTER — Emergency Department (HOSPITAL_BASED_OUTPATIENT_CLINIC_OR_DEPARTMENT_OTHER)
Admission: EM | Admit: 2016-06-28 | Discharge: 2016-06-28 | Disposition: A | Discharge status: Home / Self Care | Payer: Worker's Compensation | Attending: Emergency Medicine | Admitting: Emergency Medicine

## 2016-06-28 ENCOUNTER — Encounter (HOSPITAL_BASED_OUTPATIENT_CLINIC_OR_DEPARTMENT_OTHER): Payer: Self-pay | Admitting: *Deleted

## 2016-06-28 ENCOUNTER — Emergency Department (HOSPITAL_BASED_OUTPATIENT_CLINIC_OR_DEPARTMENT_OTHER): Payer: Worker's Compensation

## 2016-06-28 DIAGNOSIS — Y990 Civilian activity done for income or pay: Secondary | ICD-10-CM | POA: Insufficient documentation

## 2016-06-28 DIAGNOSIS — M25512 Pain in left shoulder: Secondary | ICD-10-CM | POA: Diagnosis not present

## 2016-06-28 DIAGNOSIS — Y929 Unspecified place or not applicable: Secondary | ICD-10-CM | POA: Insufficient documentation

## 2016-06-28 DIAGNOSIS — S4992XA Unspecified injury of left shoulder and upper arm, initial encounter: Secondary | ICD-10-CM | POA: Diagnosis present

## 2016-06-28 DIAGNOSIS — F17210 Nicotine dependence, cigarettes, uncomplicated: Secondary | ICD-10-CM | POA: Insufficient documentation

## 2016-06-28 DIAGNOSIS — X500XXA Overexertion from strenuous movement or load, initial encounter: Secondary | ICD-10-CM | POA: Diagnosis not present

## 2016-06-28 DIAGNOSIS — Y939 Activity, unspecified: Secondary | ICD-10-CM | POA: Insufficient documentation

## 2016-06-28 NOTE — Discharge Instructions (Signed)
Please schedule appointment with orthopedic surgery in 2-5 days. He is rest ice compression to left shoulder. Use as tolerated. Take her Profen or naproxen as needed for pain.  Contact a health care provider if: Your pain increases, and medicine does not help. Your joint pain does not improve within 3 days. You have increased bruising or swelling. You have a fever. You lose 10 lb (4.5 kg) or more without trying. Get help right away if: You are not able to move the joint. Your fingers or toes become numb or they turn cold and blue.

## 2016-06-28 NOTE — ED Provider Notes (Signed)
[facility]   CSN: [id number] Arrival date & time: 06/28/16  1302     History   Chief Complaint Chief Complaint  Patient presents with  . Shoulder Injury    HPI Jonh Mcqueary is a 36 y.o. male presents today with Complaints of shoulder injury 2 days ago. He describe achy and sharp, constant 5/10 pain at rest. He reports associated mild tenderness to left superior trapezius muscle. He states that movement in any direction and using the arm makes it feel like a 10/10. He states yesterday was worse but still having moderate to severe pain today. He reports trying "a lot" of her ibuprofen today for symptoms with mild relief. He states resting the arm makes his symptoms better. He denies any fevers, chills, nausea, vomiting, numbness, tingling.    The history is provided by the patient. No language interpreter was used.  Shoulder Injury     History reviewed. No pertinent past medical history.  Patient Active Problem List   Diagnosis Date Noted  . Tenosynovitis of finger 07/06/2014    Past Surgical History:  Procedure Laterality Date  . APPENDECTOMY    . I&D EXTREMITY Right 07/05/2014   Procedure: IRRIGATION AND DEBRIDEMENT RIGHT LONG FINGER FLEXOR SHEATH;  Surgeon: Betha Loa, MD;  Location: [facility] OR;  Service: Orthopedics;  Laterality: Right;  . TONSILLECTOMY    . WISDOM TOOTH EXTRACTION         Home Medications    Prior to Admission medications   Not on File    Family History History reviewed. No pertinent family history.  Social History Social History  Substance Use Topics  . Smoking status: Current Every Day Smoker    Packs/day: 1.00    Types: Cigarettes  . Smokeless tobacco: Never Used  . Alcohol use No     Allergies   Penicillins   Review of Systems Review of Systems  Constitutional: Negative for chills and fever.  Musculoskeletal: Positive for arthralgias. Negative for neck pain and neck stiffness.  Neurological: Negative  for numbness.     Physical Exam Updated Vital Signs BP (!) 136/101 (BP Location: Right Arm)   Pulse 96   Temp 98 F (36.7 C) (Oral)   Resp 16   Ht 6\' 1"  (1.854 m)   Wt 93 kg   SpO2 100%   BMI 27.05 kg/m   Physical Exam  Constitutional: He is oriented to person, place, and time. He appears well-developed and well-nourished.  Well appearing  HENT:  Head: Normocephalic and atraumatic.  Nose: Nose normal.  Eyes: Conjunctivae and EOM are normal.  Neck: Normal range of motion.  Cardiovascular: Normal rate and intact distal pulses.   Intact distal pulses on upper extremities  Pulmonary/Chest: Effort normal. No respiratory distress.  Normal work of breathing. No respiratory distress noted.   Abdominal: Soft.  Musculoskeletal: Normal range of motion.  No obvious bony deformity, muscle atrophy, redness, swelling, or instability bilaterally. Patient has decreased active and passive range of motion of left shoulder due to pain and discomfort.  No TTP over elbow.  Negative cross-over test, negative Leanord Asal test, negative empty can test, and negative drop arm test bilaterally.    Neurological: He is alert and oriented to person, place, and time.  Upper extremities with sensation intact. Muscle strength 5/5 upper extremities.  Skin: Skin is warm.  Psychiatric: He has a normal mood and affect. His behavior is normal.  Nursing note and vitals reviewed.    ED Treatments /  Results  Labs (all labs ordered are listed, but only abnormal results are displayed) Labs Reviewed - No data to display  EKG  EKG Interpretation None       Radiology Dg Shoulder Left  Result Date: 06/28/2016 CLINICAL DATA:  Injury, left shoulder pain after lifting yesterday at work EXAM: LEFT SHOULDER - 2+ VIEW COMPARISON:  None. FINDINGS: Three views of the left shoulder submitted. No acute fracture or subluxation. Mild degenerative changes AC joint. Glenohumeral joint is preserved. IMPRESSION: No acute  fracture or subluxation. Mild degenerative changes AC joint. Glenohumeral joint is preserved Electronically Signed   By: Natasha MeadLiviu  Pop M.D.   On: 06/28/2016 13:25    Procedures Procedures (including critical care time)  Medications Ordered in ED Medications - No data to display   Initial Impression / Assessment and Plan / ED Course  I have reviewed the triage vital signs and the nursing notes.  Pertinent labs & imaging results that were available during my care of the patient were reviewed by me and considered in my medical decision making (see chart for details).    Patient X-Ray negative for obvious fracture or dislocation.  Pt advised to follow up with orthopedics if symptoms persist for possibility of missed fracture diagnosis or for rotator cuff involvement. Patient given arm sling while in ED, conservative therapy recommended and discussed. Patient in NAD. BP readings discussed with patient and advised to see PCP for further evaluation and management. Patient will be dc home & is agreeable with above plan. Reasons to immediately return to the emergency department discussed.   Final Clinical Impressions(s) / ED Diagnoses   Final diagnoses:  Acute pain of left shoulder    New Prescriptions There are no discharge medications for this patient.    [address]Enid Maultsby Manuel MansfieldEspina, GeorgiaPA 06/28/16 1722    Nira ConnPedro Eduardo Cardama, MD 06/29/16 [phone]

## 2016-06-28 NOTE — ED Triage Notes (Signed)
Pt c/o left shoulder injury at work x 2 days ago

## 2016-06-30 ENCOUNTER — Ambulatory Visit: Payer: Self-pay | Admitting: Family Medicine

## 2016-07-07 ENCOUNTER — Ambulatory Visit (INDEPENDENT_AMBULATORY_CARE_PROVIDER_SITE_OTHER): Payer: Worker's Compensation | Admitting: Family Medicine

## 2016-07-07 ENCOUNTER — Encounter: Payer: Self-pay | Admitting: Family Medicine

## 2016-07-07 DIAGNOSIS — S4992XA Unspecified injury of left shoulder and upper arm, initial encounter: Secondary | ICD-10-CM | POA: Diagnosis not present

## 2016-07-07 NOTE — Patient Instructions (Signed)
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 These typically take 6-8 weeks to heal. Ibuprofen 600mg  three times a day with food OR aleve 2 tabs twice a day with food for pain and inflammation. Vitamin B6 50mg  daily may help with nerve healing. Try to avoid overhead motions, reaching if you can avoid these. Do arm circles, swings, table slides 3 sets of 10 once or twice a day to help regain your motion. Consider physical therapy, prednisone dose pack if you're not improving as expected. Follow up with me in 4 weeks but return sooner if not improving as expected.

## 2016-07-10 DIAGNOSIS — S4992XA Unspecified injury of left shoulder and upper arm, initial encounter: Secondary | ICD-10-CM | POA: Insufficient documentation

## 2016-07-10 NOTE — Assessment & Plan Note (Signed)
exam is reassuring.  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  Expect 6-8 weeks to resolve.  Discussed ibuprofen or aleve.  Vitamin B6 may be beneficial.  Shown home motions to do daily to regain motion.  Consider PT, prednisone if not improving.  F/u in 4 weeks.

## 2016-07-10 NOTE — Progress Notes (Signed)
PCP: No PCP Per Patient  Subjective:   HPI: Patient is a 36 y.o. male here for left shoulder injury.  Patient reports 2 weeks ago he was at work picking up a tote that had patterns in it and felt something pop in area on left behind his collarbone. Felt like area was on fire with radiation into left arm under armpit and tricep. No prior issues here. Pain currently 3/10 and achy. Taking ibuprofen. Iced initially. No skin changes, numbness.  No past medical history on file.  No current outpatient prescriptions on file prior to visit.   No current facility-administered medications on file prior to visit.     Past Surgical History:  Procedure Laterality Date  . APPENDECTOMY    . I&D EXTREMITY Right 07/05/2014   Procedure: IRRIGATION AND DEBRIDEMENT RIGHT LONG FINGER FLEXOR SHEATH;  Surgeon: Betha LoaKevin Kuzma, MD;  Location: [facility] OR;  Service: Orthopedics;  Laterality: Right;  . TONSILLECTOMY    . WISDOM TOOTH EXTRACTION      Allergies  Allergen Reactions  . Penicillins Rash    Social History   Social History  . Marital status: Divorced    Spouse name: N/A  . Number of children: N/A  . Years of education: N/A   Occupational History  . Not on file.   Social History Main Topics  . Smoking status: Current Every Day Smoker    Packs/day: 1.00    Types: Cigarettes  . Smokeless tobacco: Never Used  . Alcohol use No  . Drug use: No  . Sexual activity: Not on file   Other Topics Concern  . Not on file   Social History Narrative  . No narrative on file    No family history on file.  BP (!) 135/94   Pulse 90   Ht 6\' 1"  (1.854 m)   Wt 205 lb (93 kg)   BMI 27.05 kg/m   Review of Systems: See HPI above.     Objective:  Physical Exam:  Gen: NAD, comfortable in exam room  Left shoulder: No swelling, ecchymoses.  No gross deformity. Mild TTP post to clavicle.  No other tenderness. FROM neck without pain.  Full shoulder ER.  Abduction and flexion to 90  degrees. Negative Hawkins, Neers. Negative Yergasons. Strength 5/5 with empty can and resisted internal/external rotation. Negative apprehension. NV intact distally.  Right shoulder: FROM without pain.   Assessment & Plan:  1. Left shoulder pain - exam is reassuring.  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  Expect 6-8 weeks to resolve.  Discussed ibuprofen or aleve.  Vitamin B6 may be beneficial.  Shown home motions to do daily to regain motion.  Consider PT, prednisone if not improving.  F/u in 4 weeks.

## 2018-02-12 ENCOUNTER — Encounter (HOSPITAL_BASED_OUTPATIENT_CLINIC_OR_DEPARTMENT_OTHER): Payer: Self-pay | Admitting: *Deleted

## 2018-02-12 ENCOUNTER — Emergency Department (HOSPITAL_BASED_OUTPATIENT_CLINIC_OR_DEPARTMENT_OTHER)
Admission: EM | Admit: 2018-02-12 | Discharge: 2018-02-12 | Disposition: A | Discharge status: Home / Self Care | Payer: Self-pay | Attending: Emergency Medicine | Admitting: Emergency Medicine

## 2018-02-12 ENCOUNTER — Other Ambulatory Visit: Payer: Self-pay

## 2018-02-12 DIAGNOSIS — M10071 Idiopathic gout, right ankle and foot: Secondary | ICD-10-CM | POA: Insufficient documentation

## 2018-02-12 DIAGNOSIS — F17210 Nicotine dependence, cigarettes, uncomplicated: Secondary | ICD-10-CM | POA: Insufficient documentation

## 2018-02-12 MED ORDER — OXYCODONE HCL 5 MG PO TABS
5.0000 mg | ORAL_TABLET | Freq: Once | ORAL | Status: AC
  Administered 2018-02-12: 5 mg via ORAL
  Filled 2018-02-12: qty 1

## 2018-02-12 MED ORDER — COLCHICINE 0.6 MG PO TABS: 0.6000 mg | ORAL_TABLET | Freq: Every day | ORAL | 0 refills | Status: AC

## 2018-02-12 MED ORDER — IBUPROFEN 800 MG PO TABS
800.0000 mg | ORAL_TABLET | Freq: Once | ORAL | Status: AC
  Administered 2018-02-12: 800 mg via ORAL
  Filled 2018-02-12: qty 1

## 2018-02-12 MED ORDER — COLCHICINE 0.6 MG PO TABS
1.2000 mg | ORAL_TABLET | Freq: Once | ORAL | Status: AC
  Administered 2018-02-12: 1.2 mg via ORAL
  Filled 2018-02-12: qty 2

## 2018-02-12 MED ORDER — ACETAMINOPHEN 500 MG PO TABS
1000.0000 mg | ORAL_TABLET | Freq: Once | ORAL | Status: AC
Start: 2018-02-12 — End: 2018-02-12
  Administered 2018-02-12: 1000 mg via ORAL
  Filled 2018-02-12: qty 2

## 2018-02-12 MED ORDER — MORPHINE SULFATE 15 MG PO TABS: 15.0000 mg | ORAL_TABLET | ORAL | 0 refills | Status: AC | PRN

## 2018-02-12 NOTE — Discharge Instructions (Signed)
Take 4 over the counter ibuprofen tablets 3 times a day or 2 over-the-counter naproxen tablets twice a day for pain. Also take tylenol 1000mg (2 extra strength) four times a day.   Then take the pain medicine if you feel like you need it. Narcotics do not help with the pain, they only make you care about it less.  You can become addicted to this, people may break into your house to steal it.  It will constipate you.  If you drive under the influence of this medicine you can get a DUI.     Take the colchicine tablet about an hour after taking it here.   Return for fever, rapid spreading redness.   Please follow up with a family doctor.  There are some upstairs, I have provided the address and phone number of one.

## 2018-02-12 NOTE — ED Provider Notes (Signed)
[facility] EMERGENCY DEPARTMENT Provider Note   CSN: [id number] Arrival date & time: 02/12/18  1649     History   Chief Complaint Chief Complaint  Patient presents with  . Foot Pain    HPI Jesse Barnett is a 37 y.o. male.  37 yo M with a chief complaint of foot pain.  The patient has a remote history of gout and thinks this feels similar.  He used to be on allopurinol.  Has had in this joint before.  Denies fevers or chills.  Denies trauma.  The history is provided by the patient and the spouse.  Foot Pain  This is a recurrent problem. The current episode started yesterday. The problem occurs constantly. The problem has not changed since onset.Pertinent negatives include no chest pain, no abdominal pain, no headaches and no shortness of breath. The symptoms are aggravated by bending, twisting and walking. Nothing relieves the symptoms. He has tried nothing for the symptoms. The treatment provided no relief.    History reviewed. No pertinent past medical history.  Patient Active Problem List   Diagnosis Date Noted  . Shoulder injury, left, initial encounter 07/10/2016  . Hypogonadism male 09/16/2015  . Tenosynovitis of finger 07/06/2014    Past Surgical History:  Procedure Laterality Date  . APPENDECTOMY    . I&D EXTREMITY Right 07/05/2014   Procedure: IRRIGATION AND DEBRIDEMENT RIGHT LONG FINGER FLEXOR SHEATH;  Surgeon: Betha Loa, MD;  Location: [facility] OR;  Service: Orthopedics;  Laterality: Right;  . TONSILLECTOMY    . WISDOM TOOTH EXTRACTION          Home Medications    Prior to Admission medications   Medication Sig Start Date End Date Taking? Authorizing Provider  ibuprofen (ADVIL,MOTRIN) 200 MG tablet Take 200 mg by mouth every 6 (six) hours as needed.   Yes [provider]  colchicine 0.6 MG tablet Take 1 tablet (0.6 mg total) by mouth daily. 02/12/18   Melene Plan, DO  morphine (MSIR) 15 MG tablet Take 1 tablet (15 mg total) by mouth every 4  (four) hours as needed for severe pain. 02/12/18   Melene Plan, DO    Family History History reviewed. No pertinent family history.  Social History Social History   Tobacco Use  . Smoking status: Current Every Day Smoker    Packs/day: 1.00    Types: Cigarettes  . Smokeless tobacco: Never Used  Substance Use Topics  . Alcohol use: No  . Drug use: No     Allergies   Penicillins   Review of Systems Review of Systems  Constitutional: Negative for chills and fever.  HENT: Negative for congestion and facial swelling.   Eyes: Negative for discharge and visual disturbance.  Respiratory: Negative for shortness of breath.   Cardiovascular: Negative for chest pain and palpitations.  Gastrointestinal: Negative for abdominal pain, diarrhea and vomiting.  Musculoskeletal: Positive for arthralgias. Negative for myalgias.  Skin: Negative for color change and rash.  Neurological: Negative for tremors, syncope and headaches.  Psychiatric/Behavioral: Negative for confusion and dysphoric mood.     Physical Exam Updated Vital Signs BP (!) 138/91 (BP Location: Left Arm)   Pulse 98   Temp 98.5 F (36.9 C)   Resp 18   Ht 6\' 1"  (1.854 m)   Wt 90.7 kg   SpO2 100%   BMI 26.39 kg/m   Physical Exam  Constitutional: He is oriented to person, place, and time. He appears well-developed and well-nourished.  HENT:  Head: Normocephalic  and atraumatic.  Eyes: Pupils are equal, round, and reactive to light. EOM are normal.  Neck: Normal range of motion. Neck supple. No JVD present.  Cardiovascular: Normal rate and regular rhythm. Exam reveals no gallop and no friction rub.  No murmur heard. Pulmonary/Chest: No respiratory distress. He has no wheezes.  Abdominal: He exhibits no distension. There is no rebound and no guarding.  Musculoskeletal: Normal range of motion.  Tenderness and warmth to the right first MTP.   Neurological: He is alert and oriented to person, place, and time.  Skin: No  rash noted. No pallor.  Psychiatric: He has a normal mood and affect. His behavior is normal.  Nursing note and vitals reviewed.    ED Treatments / Results  Labs (all labs ordered are listed, but only abnormal results are displayed) Labs Reviewed - No data to display  EKG None  Radiology No results found.  Procedures Procedures (including critical care time)  Medications Ordered in ED Medications  acetaminophen (TYLENOL) tablet 1,000 mg (has no administration in time range)  ibuprofen (ADVIL,MOTRIN) tablet 800 mg (has no administration in time range)  colchicine tablet 1.2 mg (has no administration in time range)  oxyCODONE (Oxy IR/ROXICODONE) immediate release tablet 5 mg (has no administration in time range)     Initial Impression / Assessment and Plan / ED Course  I have reviewed the triage vital signs and the nursing notes.  Pertinent labs & imaging results that were available during my care of the patient were reviewed by me and considered in my medical decision making (see chart for details).     37 yo M with a chief complaint of right MTP pain.  Patient has a history of gout and thinks this feels similar.  Has had it in this joint before.  Offered perform arthrocentesis.  Patient declining at this time. Will treat with colchicine and NSAIDs PCP follow-up.  5:13 PM:  I have discussed the diagnosis/risks/treatment options with the patient and family and believe the pt to be eligible for discharge home to follow-up with PCP. We also discussed returning to the ED immediately if new or worsening sx occur. We discussed the sx which are most concerning (e.g., sudden worsening pain, fever, inability to tolerate by mouth) that necessitate immediate return. Medications administered to the patient during their visit and any new prescriptions provided to the patient are listed below.  Medications given during this visit Medications  acetaminophen (TYLENOL) tablet 1,000 mg (has no  administration in time range)  ibuprofen (ADVIL,MOTRIN) tablet 800 mg (has no administration in time range)  colchicine tablet 1.2 mg (has no administration in time range)  oxyCODONE (Oxy IR/ROXICODONE) immediate release tablet 5 mg (has no administration in time range)      The patient appears reasonably screen and/or stabilized for discharge and I doubt any other medical condition or other [facility] requiring further screening, evaluation, or treatment in the ED at this time prior to discharge.    Final Clinical Impressions(s) / ED Diagnoses   Final diagnoses:  Acute idiopathic gout involving toe of right foot    ED Discharge Orders         Ordered    colchicine 0.6 MG tablet  Daily     02/12/18 1709    morphine (MSIR) 15 MG tablet  Every 4 hours PRN     02/12/18 1709           Melene Plan, DO 02/12/18 1713

## 2018-02-12 NOTE — ED Triage Notes (Signed)
Pt says he has a gout flare up in his R big toe. States that it is very painful.

## 2018-04-11 ENCOUNTER — Emergency Department (HOSPITAL_BASED_OUTPATIENT_CLINIC_OR_DEPARTMENT_OTHER): Payer: No Typology Code available for payment source

## 2018-04-11 ENCOUNTER — Encounter (HOSPITAL_BASED_OUTPATIENT_CLINIC_OR_DEPARTMENT_OTHER): Payer: Self-pay

## 2018-04-11 ENCOUNTER — Emergency Department (HOSPITAL_BASED_OUTPATIENT_CLINIC_OR_DEPARTMENT_OTHER)
Admission: EM | Admit: 2018-04-11 | Discharge: 2018-04-11 | Disposition: A | Discharge status: Home / Self Care | Payer: No Typology Code available for payment source | Attending: Emergency Medicine | Admitting: Emergency Medicine

## 2018-04-11 ENCOUNTER — Other Ambulatory Visit: Payer: Self-pay

## 2018-04-11 DIAGNOSIS — R0781 Pleurodynia: Secondary | ICD-10-CM | POA: Diagnosis present

## 2018-04-11 DIAGNOSIS — Z79899 Other long term (current) drug therapy: Secondary | ICD-10-CM | POA: Diagnosis not present

## 2018-04-11 DIAGNOSIS — M542 Cervicalgia: Secondary | ICD-10-CM | POA: Insufficient documentation

## 2018-04-11 DIAGNOSIS — Y999 Unspecified external cause status: Secondary | ICD-10-CM | POA: Diagnosis not present

## 2018-04-11 DIAGNOSIS — Y92410 Unspecified street and highway as the place of occurrence of the external cause: Secondary | ICD-10-CM | POA: Insufficient documentation

## 2018-04-11 DIAGNOSIS — Y9389 Activity, other specified: Secondary | ICD-10-CM | POA: Insufficient documentation

## 2018-04-11 DIAGNOSIS — F17210 Nicotine dependence, cigarettes, uncomplicated: Secondary | ICD-10-CM | POA: Insufficient documentation

## 2018-04-11 MED ORDER — HYDROCODONE-ACETAMINOPHEN 5-325 MG PO TABS: 1.0000 | ORAL_TABLET | Freq: Four times a day (QID) | ORAL | 0 refills | Status: AC | PRN

## 2018-04-11 MED ORDER — METHOCARBAMOL 500 MG PO TABS
1000.0000 mg | ORAL_TABLET | Freq: Once | ORAL | Status: AC
  Administered 2018-04-11: 1000 mg via ORAL
  Filled 2018-04-11: qty 2

## 2018-04-11 MED ORDER — NAPROXEN 250 MG PO TABS
500.0000 mg | ORAL_TABLET | Freq: Once | ORAL | Status: AC
  Administered 2018-04-11: 500 mg via ORAL
  Filled 2018-04-11: qty 2

## 2018-04-11 MED ORDER — NAPROXEN 500 MG PO TABS: 500.0000 mg | ORAL_TABLET | Freq: Two times a day (BID) | ORAL | 0 refills | Status: AC

## 2018-04-11 MED ORDER — METHOCARBAMOL 500 MG PO TABS: 500.0000 mg | ORAL_TABLET | Freq: Two times a day (BID) | ORAL | 0 refills | Status: AC

## 2018-04-11 MED ORDER — HYDROCODONE-ACETAMINOPHEN 5-325 MG PO TABS
1.0000 | ORAL_TABLET | Freq: Once | ORAL | Status: AC
  Administered 2018-04-11: 1 via ORAL
  Filled 2018-04-11: qty 1
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
## 2018-04-11 NOTE — ED Notes (Signed)
Patient transported to X-ray 

## 2018-04-11 NOTE — ED Notes (Signed)
ED Provider at bedside. 
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
## 2018-04-11 NOTE — Discharge Instructions (Addendum)
Expect your soreness to increase over the next 2-3 days. Take it easy, but do not lay around too much as this may make any stiffness worse.  Antiinflammatory medications: Take 600 mg of ibuprofen every 6 hours or 440 mg (over the counter dose) to 500 mg (prescription dose) of naproxen every 12 hours for the next 3 days. After this time, these medications may be used as needed for pain. Take these medications with food to avoid upset stomach. Choose only one of these medications, do not take them together. Acetaminophen (generic for Tylenol): Should you continue to have additional pain while taking the ibuprofen or naproxen, you may add in acetaminophen as needed. Your daily total maximum amount of acetaminophen from all sources should be limited to 4000mg /day for persons without liver problems, or 2000mg /day for those with liver problems. Vicodin: May take Vicodin (hydrocodone-acetaminophen) as needed for severe pain.  Do not drive or perform other dangerous activities while taking the Vicodin.  Please note that each pill of Vicodin contains 325 mg of acetaminophen (Tylenol) and the above dosage limits apply. Muscle relaxer: Robaxin is a muscle relaxer and may help loosen stiff muscles. Do not take the Robaxin while driving or performing other dangerous activities.  Lidocaine patches: These are available via either prescription or over-the-counter. The over-the-counter option may be more economical one and are likely just as effective. There are multiple over-the-counter brands, such as Salonpas. Exercises: Be sure to perform the attached exercises starting with three times a week and working up to performing them daily. This is an essential part of preventing long term problems.  Follow up: Follow-up with the orthopedist as soon as possible in this manner.  If symptoms of tingling and neck pain continue, be sure to follow-up within the next week, if possible. Return: Return to the ED should symptoms  worsen.  For prescription assistance, may try using prescription discount sites or apps, such as goodrx.com

## 2018-04-11 NOTE — ED Notes (Signed)
Patient transported to CT 
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
